# Patient Record
Sex: Male | Born: 1965 | Race: White | Hispanic: No | Marital: Married | Smoking: Current every day smoker
Health system: Southern US, Community
[De-identification: ages and names within clinical notes are randomized; demographics above are authoritative.]

## PROBLEM LIST (undated history)

## (undated) DIAGNOSIS — R1313 Dysphagia, pharyngeal phase: Secondary | ICD-10-CM

## (undated) DIAGNOSIS — R51 Headache: Secondary | ICD-10-CM

## (undated) DIAGNOSIS — M48061 Spinal stenosis, lumbar region without neurogenic claudication: Secondary | ICD-10-CM

## (undated) DIAGNOSIS — K219 Gastro-esophageal reflux disease without esophagitis: Secondary | ICD-10-CM

## (undated) DIAGNOSIS — J189 Pneumonia, unspecified organism: Secondary | ICD-10-CM

---

## 2004-09-02 DIAGNOSIS — J189 Pneumonia, unspecified organism: Secondary | ICD-10-CM

## 2004-09-02 HISTORY — DX: Pneumonia, unspecified organism: J18.9

## 2004-09-02 HISTORY — PX: ANTERIOR CERVICAL DECOMP/DISCECTOMY FUSION: SHX1161

## 2013-09-02 DIAGNOSIS — M48061 Spinal stenosis, lumbar region without neurogenic claudication: Secondary | ICD-10-CM

## 2013-09-02 HISTORY — DX: Spinal stenosis, lumbar region without neurogenic claudication: M48.061

## 2013-10-13 ENCOUNTER — Encounter: Payer: Self-pay | Admitting: Neurology

## 2013-10-14 ENCOUNTER — Ambulatory Visit (INDEPENDENT_AMBULATORY_CARE_PROVIDER_SITE_OTHER): Payer: BC Managed Care – PPO | Admitting: Neurology

## 2013-10-14 ENCOUNTER — Encounter (INDEPENDENT_AMBULATORY_CARE_PROVIDER_SITE_OTHER): Payer: Self-pay

## 2013-10-14 ENCOUNTER — Encounter: Payer: Self-pay | Admitting: Neurology

## 2013-10-14 VITALS — BP 121/76 | HR 88 | Ht 66.5 in | Wt 178.0 lb

## 2013-10-14 DIAGNOSIS — M545 Low back pain, unspecified: Secondary | ICD-10-CM

## 2013-10-14 MED ORDER — GABAPENTIN 300 MG PO CAPS: 300.0000 mg | ORAL_CAPSULE | Freq: Two times a day (BID) | ORAL | Status: DC
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
## 2013-10-14 NOTE — Patient Instructions (Signed)
Overall you are doing fairly well but I do want to suggest a few things today:   Remember to drink plenty of fluid, eat healthy meals and do not skip any meals. Try to eat protein with a every meal and eat a healthy snack such as fruit or nuts in between meals. Try to keep a regular sleep-wake schedule and try to exercise daily, particularly in the form of walking, 20-30 minutes a day, if you can.   As far as your medications are concerned, I would like to suggest the following: 1)Start gabapentin 300mg  twice a day  As far as diagnostic testing:  1)We will order a MRI of your lumbar spine. Someone will call you in a few days to schedule this  We will place a referral for physical therapy to help treat your back pain. They should call you in a few days to schedule this.   Please call us with any interim questions, concerns, problems, updates or refill requests.   My clinical assistant and will answer any of your questions and relay your messages to me and also relay most of my messages to you.   Our phone number is [phone]. We also have an after hours call service for urgent matters and there is a physician on-call for urgent questions. For any emergencies you know to call 911 or go to the nearest emergency room

## 2013-10-27 ENCOUNTER — Ambulatory Visit (INDEPENDENT_AMBULATORY_CARE_PROVIDER_SITE_OTHER): Payer: BC Managed Care – PPO

## 2013-10-27 ENCOUNTER — Other Ambulatory Visit: Payer: Self-pay | Admitting: Neurology

## 2013-10-27 DIAGNOSIS — M545 Low back pain, unspecified: Secondary | ICD-10-CM

## 2013-11-01 ENCOUNTER — Telehealth: Payer: Self-pay | Admitting: Neurology

## 2013-11-01 DIAGNOSIS — M545 Low back pain, unspecified: Secondary | ICD-10-CM

## 2013-11-01 NOTE — Telephone Encounter (Signed)
Called patient back to discuss results. Will refer to back surgeon for further evaluation.

## 2014-03-17 ENCOUNTER — Encounter (HOSPITAL_COMMUNITY): Payer: BC Managed Care – PPO | Admitting: Pharmacy Technician

## 2014-03-21 NOTE — H&P (Addendum)
Ricardo KennerMarc Flores is an 48 y.o. male.   Chief Complaint:  Low back pain and bilat LE radiculopathy HPI:  48 yo wm with hx of L2-S1 stenosis presented to our office today for preop evaluation.  Symptoms unchanged from previous visit.  Wants to proceed with L2-S1 decompression as scheduled this week.  Allergies(Ricardo Flores; 02/22/2014 10:10 AM) Oxycodone-Acetaminophen *ANALGESICS - OPIOID*. Nausea. Codeine Derivatives    Social History(Ricardo Flores; 02/22/2014 10:10 AM) Tobacco use. Current every day smoker. 02/15/2014: smoke(d) 3/4 pack(s) per day    Medication History(Ricardo Flores; 02/22/2014 10:10 AM) Meloxicam (15MG  Tablet, Oral) Active. Medications Reconciled.    Other Problems(Ricardo Flores; 02/22/2014 10:10 AM) Hypercholesterolemia    Review of Systems  Constitutional: Negative.   HENT: Negative.   Eyes: Negative.   Respiratory: Negative.   Cardiovascular: Negative.   Gastrointestinal: Negative.   Genitourinary: Negative.   Musculoskeletal: Positive for back pain.  Skin: Negative.   Neurological: Positive for tingling.  Psychiatric/Behavioral: Negative.     There were no vitals taken for this visit. Physical Exam  Constitutional: He is oriented to person, place, and time. He appears well-developed and well-nourished.  HENT:  Head: Normocephalic and atraumatic.  Eyes: EOM are normal. Pupils are equal, round, and reactive to light.  Neck: Normal range of motion.  Cardiovascular: Normal rate and regular rhythm.   Respiratory: Effort normal and breath sounds normal.  GI: Soft. Bowel sounds are normal.  Neurological: He is alert and oriented to person, place, and time.  Skin: Skin is warm and dry.  Psychiatric: He has a normal mood and affect.     Assessment/Plan Lumbar stenosis.   Will proceed with L2-S1 decompression as scheduled.  Surgical procedure along with potential risks, complications and recovery time discussed.  All questions answered.    Ricardo Flores 03/21/2014, 9:23 AM  His x-rays show no spondylolisthesis or scoliosis. There is no gross instability. I do agree with the MRI report, there is multilevel spinal stenosis. There is severe central stenosis at L2-L3 and L3-L4. There is significant foraminal stenosis L2-L3, L3-L4, and L4-L5 with nerve compression. There is no significant central stenosis at L5-S1, but he does have severe bilateral foraminal stenosis. At this point in time we have had a long conversation about spinal stenosis. We have talked about injection therapy and physical therapy and he indicates that he would like to proceed straight to surgery. Given the severity of the disease, it is not unreasonable to do a lumbar decompression. I do not think instrumented fusion is going to be required because there is no sagittal imbalance. We discussed the risks which include infection, bleeding, nerve damage, death, stroke, paralysis, recurrent spinal stenosis, need for pedicle screws, fusion or cages in the future, ongoing or worse pain, and loss of bowel and bladder control. The goal of surgery is to essentially decompress the spine to improve his quality of life. Essentially what I told him is he will feel the relief that he gets when he forward flexes, but he is not forward flexing. All of his questions were encouraged and addressed. We will get preoperative medical clearance from his primary care physician and he will be out of work for about 3 months with a gradual restricted return to duties over a 3-5 month period.

## 2014-03-22 ENCOUNTER — Encounter (HOSPITAL_COMMUNITY): Payer: Self-pay

## 2014-03-22 ENCOUNTER — Encounter (HOSPITAL_COMMUNITY)
Admission: RE | Admit: 2014-03-22 | Discharge: 2014-03-22 | Disposition: A | Discharge status: Home / Self Care | Payer: BC Managed Care – PPO | Source: Ambulatory Visit | Attending: Orthopedic Surgery | Admitting: Orthopedic Surgery

## 2014-03-22 HISTORY — DX: Pneumonia, unspecified organism: J18.9

## 2014-03-22 HISTORY — DX: Spinal stenosis, lumbar region without neurogenic claudication: M48.061

## 2014-03-22 HISTORY — DX: Dysphagia, pharyngeal phase: R13.13

## 2014-03-22 HISTORY — DX: Gastro-esophageal reflux disease without esophagitis: K21.9

## 2014-03-22 HISTORY — DX: Headache: R51

## 2014-03-22 LAB — BASIC METABOLIC PANEL
Anion gap: 14 (ref 5–15)
BUN: 11 mg/dL (ref 6–23)
CHLORIDE: 101 meq/L (ref 96–112)
CO2: 27 mEq/L (ref 19–32)
Calcium: 9.1 mg/dL (ref 8.4–10.5)
Creatinine, Ser: 0.95 mg/dL (ref 0.50–1.35)
GFR calc non Af Amer: >90 mL/min (ref >90)
GLUCOSE: 62 mg/dL — AB (ref 70–99)
Potassium: 3.9 mEq/L (ref 3.7–5.3)
Sodium: 142 mEq/L (ref 137–147)

## 2014-03-22 LAB — CBC
HCT: 40.8 % (ref 39.0–52.0)
HEMOGLOBIN: 14.2 g/dL (ref 13.0–17.0)
MCH: 30.3 pg (ref 26.0–34.0)
MCHC: 34.8 g/dL (ref 30.0–36.0)
MCV: 87 fL (ref 78.0–100.0)
Platelets: 252 10*3/uL (ref 150–400)
RBC: 4.69 MIL/uL (ref 4.22–5.81)
RDW: 12.8 % (ref 11.5–15.5)
WBC: 7.1 10*3/uL (ref 4.0–10.5)

## 2014-03-22 LAB — SURGICAL PCR SCREEN
MRSA, PCR: NEGATIVE
Staphylococcus aureus: NEGATIVE

## 2014-03-22 NOTE — Pre-Procedure Instructions (Signed)
Ricardo KennerMarc Flores  03/22/2014   Your procedure is scheduled on:  Thursday, July 23  [facility] at 0930 AM.  Call this number if you have problems the morning of surgery: [phone]   Remember:   Do not eat food or drink liquids after midnight.Wednesday night   Take these medicines the morning of surgery with A SIP OF WATER: methocarbamol(Robaxin) as needed   Do not wear jewelry, make-up or nail polish.  Do not wear lotions, powders, or perfumes. You may not wear deodorant.  Do not shave 48 hours prior to surgery. Men may shave face and neck.  Do not bring valuables to the hospital.  [facility] is not responsible   for any belongings or valuables.               Contacts, dentures or bridgework may not be worn into surgery.  Leave suitcase in the car. After surgery it may be brought to your room.  For patients admitted to the hospital, discharge time is determined by your  treatment team.               Patients discharged the day of surgery will not be allowed to drive home.  Name and phone number of your driver:      Special Instructions: [place] - Preparing for Surgery  Before surgery, you can play an important role.  Because skin is not sterile, your skin needs to be as free of germs as possible.  You can reduce the number of germs on you skin by washing with CHG (chlorahexidine gluconate) soap before surgery.  CHG is an antiseptic cleaner which kills germs and bonds with the skin to continue killing germs even after washing.  Please DO NOT use if you have an allergy to CHG or antibacterial soaps.  If your skin becomes reddened/irritated stop using the CHG and inform your nurse when you arrive at Short Stay.  Do not shave (including legs and underarms) for at least 48 hours prior to the first CHG shower.  You may shave your face.  Please follow these instructions carefully:   1.  Shower with CHG Soap the night before surgery and the  morning of  Surgery.  2.  If you choose to wash your hair, wash your hair first as usual with your  normal shampoo.  3.  After you shampoo, rinse your hair and body thoroughly to remove the   Shampoo.  4.  Use CHG as you would any other liquid soap.  You can apply chg directly   to the skin and wash gently with scrungie or a clean washcloth.  5.  Apply the CHG Soap to your body ONLY FROM THE NECK DOWN.   Do not use on open wounds or open sores.  Avoid contact with your eyes,  ears, mouth and genitals (private parts).  Wash genitals (private parts)   with your normal soap.  6.  Wash thoroughly, paying special attention to the area where your surgery  will be performed.  7.  Thoroughly rinse your body with warm water from the neck down.  8.  DO NOT shower/wash with your normal soap after using and rinsing off   the CHG Soap.  9.  Pat yourself dry with a clean towel.            10.  Wear clean pajamas.            11.  Place clean sheets  on your bed the night of your first shower and do not   sleep with pets.  Day of Surgery  Do not apply any lotions/deoderants the morning of surgery.  Please wear clean clothes to the hospital/surgery center.     Please read over the following fact sheets that you were given: Pain Booklet, Coughing and Deep Breathing and Surgical Site Infection Prevention

## 2014-03-23 MED ORDER — DEXAMETHASONE SODIUM PHOSPHATE 4 MG/ML IJ SOLN
4.0000 mg | Freq: Once | INTRAMUSCULAR | Status: AC
  Administered 2014-03-24: 4 mg via INTRAVENOUS

## 2014-03-23 MED ORDER — ACETAMINOPHEN 10 MG/ML IV SOLN
1000.0000 mg | Freq: Four times a day (QID) | INTRAVENOUS | Status: DC
  Administered 2014-03-24: 1000 mg via INTRAVENOUS

## 2014-03-23 NOTE — Progress Notes (Signed)
Anesthesia Chart Review:  Patient is a 48 year old male scheduled for L2-S1 decompression tomorrow by Dr. Shon BatonBrooks.  History includes smoking, GERD, ACDF, cricopharyngeal dysphagia, headaches, lumbar stenosis, PNA '06. PCP is listed as Dr. Venetia ConstableHarry Anthony Alexander.  Preoperative labs noted.  Glucose 62.  His surgery is after 11AM tomorrow, so I'll order a CBG just to make sure there is no significant hypoglycemia while he is NPO.  Velna Ochsllison Lexani Corona, PA-C [facility]/Anesthesiology Phone [phone] 03/23/2014 10:05 AM

## 2014-03-24 ENCOUNTER — Encounter (HOSPITAL_COMMUNITY): Payer: Self-pay | Admitting: Anesthesiology

## 2014-03-24 ENCOUNTER — Encounter (HOSPITAL_COMMUNITY)
Admission: RE | Disposition: A | Discharge status: Home Health | Payer: Self-pay | Source: Ambulatory Visit | Attending: Orthopedic Surgery

## 2014-03-24 ENCOUNTER — Inpatient Hospital Stay (HOSPITAL_COMMUNITY): Payer: BC Managed Care – PPO

## 2014-03-24 ENCOUNTER — Inpatient Hospital Stay (HOSPITAL_COMMUNITY): Payer: BC Managed Care – PPO | Admitting: Anesthesiology

## 2014-03-24 ENCOUNTER — Encounter (HOSPITAL_COMMUNITY): Payer: BC Managed Care – PPO | Admitting: Vascular Surgery

## 2014-03-24 ENCOUNTER — Inpatient Hospital Stay (HOSPITAL_COMMUNITY)
Admission: RE | Admit: 2014-03-24 | Discharge: 2014-03-27 | DRG: 520 | Disposition: A | Discharge status: Home Health | Payer: BC Managed Care – PPO | Source: Ambulatory Visit | Attending: Orthopedic Surgery | Admitting: Orthopedic Surgery

## 2014-03-24 DIAGNOSIS — E7800 Pure hypercholesterolemia, unspecified: Secondary | ICD-10-CM | POA: Diagnosis present

## 2014-03-24 DIAGNOSIS — F17200 Nicotine dependence, unspecified, uncomplicated: Secondary | ICD-10-CM | POA: Diagnosis present

## 2014-03-24 DIAGNOSIS — M48061 Spinal stenosis, lumbar region without neurogenic claudication: Secondary | ICD-10-CM

## 2014-03-24 DIAGNOSIS — Z01812 Encounter for preprocedural laboratory examination: Secondary | ICD-10-CM

## 2014-03-24 DIAGNOSIS — M4800 Spinal stenosis, site unspecified: Secondary | ICD-10-CM | POA: Diagnosis present

## 2014-03-24 DIAGNOSIS — K219 Gastro-esophageal reflux disease without esophagitis: Secondary | ICD-10-CM | POA: Diagnosis present

## 2014-03-24 DIAGNOSIS — R1310 Dysphagia, unspecified: Secondary | ICD-10-CM | POA: Diagnosis present

## 2014-03-24 HISTORY — PX: LUMBAR LAMINECTOMY/DECOMPRESSION MICRODISCECTOMY: SHX5026

## 2014-03-24 LAB — GLUCOSE, CAPILLARY: Glucose-Capillary: 115 mg/dL — ABNORMAL HIGH (ref 70–99)

## 2014-03-24 SURGERY — LUMBAR LAMINECTOMY/DECOMPRESSION MICRODISCECTOMY 4 LEVEL
Anesthesia: General | Site: Spine Lumbar

## 2014-03-24 MED ORDER — LIDOCAINE HCL (CARDIAC) 20 MG/ML IV SOLN
INTRAVENOUS | Status: DC | PRN
  Administered 2014-03-24: 60 mg via INTRAVENOUS

## 2014-03-24 MED ORDER — FENTANYL CITRATE 0.05 MG/ML IJ SOLN
INTRAMUSCULAR | Status: AC
  Filled 2014-03-24: qty 5

## 2014-03-24 MED ORDER — GLYCOPYRROLATE 0.2 MG/ML IJ SOLN
INTRAMUSCULAR | Status: DC | PRN
  Administered 2014-03-24: 0.4 mg via INTRAVENOUS

## 2014-03-24 MED ORDER — FENTANYL CITRATE 0.05 MG/ML IJ SOLN
25.0000 ug | INTRAMUSCULAR | Status: DC | PRN
  Administered 2014-03-24 (×4): 25 ug via INTRAVENOUS

## 2014-03-24 MED ORDER — METHOCARBAMOL 1000 MG/10ML IJ SOLN
500.0000 mg | Freq: Four times a day (QID) | INTRAVENOUS | Status: DC | PRN
  Filled 2014-03-24: qty 5

## 2014-03-24 MED ORDER — PHENYLEPHRINE HCL 10 MG/ML IJ SOLN
INTRAMUSCULAR | Status: DC | PRN
  Administered 2014-03-24 (×2): 40 ug via INTRAVENOUS

## 2014-03-24 MED ORDER — FENTANYL CITRATE 0.05 MG/ML IJ SOLN
INTRAMUSCULAR | Status: AC
  Filled 2014-03-24: qty 2

## 2014-03-24 MED ORDER — NEOSTIGMINE METHYLSULFATE 10 MG/10ML IV SOLN
INTRAVENOUS | Status: DC | PRN
  Administered 2014-03-24: 3 mg via INTRAVENOUS

## 2014-03-24 MED ORDER — MIDAZOLAM HCL 2 MG/2ML IJ SOLN
INTRAMUSCULAR | Status: AC
  Filled 2014-03-24: qty 2

## 2014-03-24 MED ORDER — SODIUM CHLORIDE 0.9 % IJ SOLN
3.0000 mL | Freq: Two times a day (BID) | INTRAMUSCULAR | Status: DC
  Administered 2014-03-26 – 2014-03-27 (×2): 3 mL via INTRAVENOUS

## 2014-03-24 MED ORDER — 0.9 % SODIUM CHLORIDE (POUR BTL) OPTIME
TOPICAL | Status: DC | PRN
  Administered 2014-03-24: 1000 mL

## 2014-03-24 MED ORDER — EPHEDRINE SULFATE 50 MG/ML IJ SOLN
INTRAMUSCULAR | Status: DC | PRN
  Administered 2014-03-24 (×2): 5 mg via INTRAVENOUS

## 2014-03-24 MED ORDER — MENTHOL 3 MG MT LOZG: 1.0000 | LOZENGE | OROMUCOSAL | Status: DC | PRN

## 2014-03-24 MED ORDER — LACTATED RINGERS IV SOLN
INTRAVENOUS | Status: DC
Start: 2014-03-24 — End: 2014-03-27
  Administered 2014-03-24 (×2): via INTRAVENOUS

## 2014-03-24 MED ORDER — METHOCARBAMOL 750 MG PO TABS
750.0000 mg | ORAL_TABLET | Freq: Four times a day (QID) | ORAL | Status: DC | PRN
  Administered 2014-03-24 – 2014-03-27 (×7): 750 mg via ORAL
  Filled 2014-03-24 (×8): qty 1

## 2014-03-24 MED ORDER — THROMBIN 20000 UNITS EX SOLR
CUTANEOUS | Status: AC
  Filled 2014-03-24: qty 20000

## 2014-03-24 MED ORDER — PROPOFOL 10 MG/ML IV BOLUS
INTRAVENOUS | Status: AC
  Filled 2014-03-24: qty 20

## 2014-03-24 MED ORDER — CEFAZOLIN SODIUM 1-5 GM-% IV SOLN
1.0000 g | Freq: Three times a day (TID) | INTRAVENOUS | Status: AC
  Administered 2014-03-24 – 2014-03-25 (×2): 1 g via INTRAVENOUS
  Filled 2014-03-24 (×2): qty 50

## 2014-03-24 MED ORDER — SODIUM CHLORIDE 0.9 % IV SOLN: 250.0000 mL | INTRAVENOUS | Status: DC

## 2014-03-24 MED ORDER — DOCUSATE SODIUM 100 MG PO CAPS
100.0000 mg | ORAL_CAPSULE | Freq: Two times a day (BID) | ORAL | Status: DC
  Administered 2014-03-24 – 2014-03-27 (×6): 100 mg via ORAL
  Filled 2014-03-24 (×7): qty 1

## 2014-03-24 MED ORDER — ACETAMINOPHEN 10 MG/ML IV SOLN
1000.0000 mg | Freq: Once | INTRAVENOUS | Status: DC
  Filled 2014-03-24: qty 100

## 2014-03-24 MED ORDER — DEXAMETHASONE 4 MG PO TABS
4.0000 mg | ORAL_TABLET | Freq: Four times a day (QID) | ORAL | Status: DC
  Administered 2014-03-25 – 2014-03-27 (×7): 4 mg via ORAL
  Filled 2014-03-24 (×15): qty 1

## 2014-03-24 MED ORDER — ALBUMIN HUMAN 5 % IV SOLN
INTRAVENOUS | Status: DC | PRN
  Administered 2014-03-24 (×2): via INTRAVENOUS

## 2014-03-24 MED ORDER — BUPIVACAINE-EPINEPHRINE 0.25% -1:200000 IJ SOLN
INTRAMUSCULAR | Status: DC | PRN
  Administered 2014-03-24: 10 mL

## 2014-03-24 MED ORDER — ONDANSETRON HCL 4 MG/2ML IJ SOLN
4.0000 mg | INTRAMUSCULAR | Status: DC | PRN
  Administered 2014-03-25: 4 mg via INTRAVENOUS
  Filled 2014-03-24: qty 2

## 2014-03-24 MED ORDER — LACTATED RINGERS IV SOLN
INTRAVENOUS | Status: DC
  Administered 2014-03-25: 01:00:00 via INTRAVENOUS

## 2014-03-24 MED ORDER — GLYCOPYRROLATE 0.2 MG/ML IJ SOLN
INTRAMUSCULAR | Status: AC
  Filled 2014-03-24: qty 2

## 2014-03-24 MED ORDER — METHOCARBAMOL 500 MG PO TABS
ORAL_TABLET | ORAL | Status: AC
  Filled 2014-03-24: qty 1

## 2014-03-24 MED ORDER — PROPOFOL 10 MG/ML IV BOLUS
INTRAVENOUS | Status: DC | PRN
  Administered 2014-03-24: 200 mg via INTRAVENOUS

## 2014-03-24 MED ORDER — ROCURONIUM BROMIDE 100 MG/10ML IV SOLN
INTRAVENOUS | Status: DC | PRN
  Administered 2014-03-24 (×2): 10 mg via INTRAVENOUS
  Administered 2014-03-24: 50 mg via INTRAVENOUS

## 2014-03-24 MED ORDER — ONDANSETRON HCL 4 MG/2ML IJ SOLN
INTRAMUSCULAR | Status: DC | PRN
  Administered 2014-03-24: 4 mg via INTRAVENOUS

## 2014-03-24 MED ORDER — ONDANSETRON HCL 4 MG/2ML IJ SOLN
INTRAMUSCULAR | Status: AC
  Filled 2014-03-24: qty 2

## 2014-03-24 MED ORDER — SURGIFOAM 100 EX MISC
CUTANEOUS | Status: DC | PRN
  Administered 2014-03-24: 12:00:00 via TOPICAL

## 2014-03-24 MED ORDER — DEXAMETHASONE SODIUM PHOSPHATE 4 MG/ML IJ SOLN
INTRAMUSCULAR | Status: AC
  Filled 2014-03-24: qty 1

## 2014-03-24 MED ORDER — ONDANSETRON HCL 4 MG/2ML IJ SOLN
4.0000 mg | Freq: Once | INTRAMUSCULAR | Status: AC | PRN
  Administered 2014-03-24: 4 mg via INTRAVENOUS

## 2014-03-24 MED ORDER — OXYCODONE HCL 5 MG PO TABS
10.0000 mg | ORAL_TABLET | ORAL | Status: DC | PRN
  Administered 2014-03-24 – 2014-03-27 (×12): 10 mg via ORAL
  Filled 2014-03-24 (×11): qty 2

## 2014-03-24 MED ORDER — PHENOL 1.4 % MT LIQD
1.0000 | OROMUCOSAL | Status: DC | PRN
  Filled 2014-03-24: qty 177

## 2014-03-24 MED ORDER — OXYCODONE HCL 5 MG PO TABS
ORAL_TABLET | ORAL | Status: AC
  Filled 2014-03-24: qty 2

## 2014-03-24 MED ORDER — ACETAMINOPHEN 10 MG/ML IV SOLN
1000.0000 mg | Freq: Four times a day (QID) | INTRAVENOUS | Status: AC
  Administered 2014-03-24: 1000 mg via INTRAVENOUS
  Filled 2014-03-24: qty 100

## 2014-03-24 MED ORDER — ROCURONIUM BROMIDE 50 MG/5ML IV SOLN
INTRAVENOUS | Status: AC
  Filled 2014-03-24: qty 1

## 2014-03-24 MED ORDER — SODIUM CHLORIDE 0.9 % IJ SOLN: 3.0000 mL | INTRAMUSCULAR | Status: DC | PRN

## 2014-03-24 MED ORDER — NEOSTIGMINE METHYLSULFATE 10 MG/10ML IV SOLN
INTRAVENOUS | Status: AC
  Filled 2014-03-24: qty 1

## 2014-03-24 MED ORDER — DEXAMETHASONE SODIUM PHOSPHATE 4 MG/ML IJ SOLN
4.0000 mg | Freq: Four times a day (QID) | INTRAMUSCULAR | Status: DC
  Administered 2014-03-24 – 2014-03-27 (×4): 4 mg via INTRAVENOUS
  Filled 2014-03-24 (×15): qty 1

## 2014-03-24 MED ORDER — BUPIVACAINE-EPINEPHRINE (PF) 0.25% -1:200000 IJ SOLN
INTRAMUSCULAR | Status: AC
  Filled 2014-03-24: qty 30

## 2014-03-24 MED ORDER — FENTANYL CITRATE 0.05 MG/ML IJ SOLN
INTRAMUSCULAR | Status: DC | PRN
  Administered 2014-03-24 (×4): 50 ug via INTRAVENOUS
  Administered 2014-03-24: 100 ug via INTRAVENOUS
  Administered 2014-03-24 (×2): 50 ug via INTRAVENOUS

## 2014-03-24 MED ORDER — MIDAZOLAM HCL 5 MG/5ML IJ SOLN
INTRAMUSCULAR | Status: DC | PRN
  Administered 2014-03-24: 2 mg via INTRAVENOUS

## 2014-03-24 MED ORDER — METHOCARBAMOL 500 MG PO TABS
500.0000 mg | ORAL_TABLET | Freq: Four times a day (QID) | ORAL | Status: DC | PRN
  Administered 2014-03-24: 500 mg via ORAL

## 2014-03-24 MED ORDER — ARTIFICIAL TEARS OP OINT
TOPICAL_OINTMENT | OPHTHALMIC | Status: DC | PRN
  Administered 2014-03-24: 1 via OPHTHALMIC

## 2014-03-24 SURGICAL SUPPLY — 60 items
BUR EGG ELITE 4.0 (BURR) IMPLANT
BUR MATCHSTICK NEURO 3.0 LAGG (BURR) ×2 IMPLANT
CANISTER SUCTION 2500CC (MISCELLANEOUS) ×2 IMPLANT
CORDS BIPOLAR (ELECTRODE) ×2 IMPLANT
COVER SURGICAL LIGHT HANDLE (MISCELLANEOUS) ×2 IMPLANT
DERMABOND ADVANCED (GAUZE/BANDAGES/DRESSINGS) ×1
DERMABOND ADVANCED .7 DNX12 (GAUZE/BANDAGES/DRESSINGS) ×1 IMPLANT
DRAIN CHANNEL 15F RND FF W/TCR (WOUND CARE) IMPLANT
DRAPE POUCH INSTRU U-SHP 10X18 (DRAPES) ×2 IMPLANT
DRAPE SURG 17X23 STRL (DRAPES) ×2 IMPLANT
DRAPE U-SHAPE 47X51 STRL (DRAPES) ×2 IMPLANT
DRSG MEPILEX BORDER 4X8 (GAUZE/BANDAGES/DRESSINGS) IMPLANT
DURAPREP 26ML APPLICATOR (WOUND CARE) ×2 IMPLANT
ELECT BLADE 4.0 EZ CLEAN MEGAD (MISCELLANEOUS)
ELECT CAUTERY BLADE 6.4 (BLADE) ×2 IMPLANT
ELECT PENCIL ROCKER SW 15FT (MISCELLANEOUS) ×2 IMPLANT
ELECT REM PT RETURN 9FT ADLT (ELECTROSURGICAL) ×2
ELECTRODE BLDE 4.0 EZ CLN MEGD (MISCELLANEOUS) IMPLANT
ELECTRODE REM PT RTRN 9FT ADLT (ELECTROSURGICAL) ×1 IMPLANT
EVACUATOR 1/8 PVC DRAIN (DRAIN) IMPLANT
EVACUATOR SILICONE 100CC (DRAIN) IMPLANT
GLOVE BIOGEL PI IND STRL 8 (GLOVE) ×1 IMPLANT
GLOVE BIOGEL PI IND STRL 8.5 (GLOVE) ×1 IMPLANT
GLOVE BIOGEL PI INDICATOR 8 (GLOVE) ×1
GLOVE BIOGEL PI INDICATOR 8.5 (GLOVE) ×1
GLOVE ECLIPSE 8.5 STRL (GLOVE) ×2 IMPLANT
GLOVE ORTHO TXT STRL SZ7.5 (GLOVE) ×2 IMPLANT
GOWN STRL REUS W/ TWL LRG LVL3 (GOWN DISPOSABLE) ×1 IMPLANT
GOWN STRL REUS W/TWL 2XL LVL3 (GOWN DISPOSABLE) ×4 IMPLANT
GOWN STRL REUS W/TWL LRG LVL3 (GOWN DISPOSABLE) ×1
KIT BASIN OR (CUSTOM PROCEDURE TRAY) ×2 IMPLANT
KIT ROOM TURNOVER OR (KITS) ×2 IMPLANT
NEEDLE 22X1 1/2 (OR ONLY) (NEEDLE) ×2 IMPLANT
NEEDLE SPNL 18GX3.5 QUINCKE PK (NEEDLE) ×4 IMPLANT
NS IRRIG 1000ML POUR BTL (IV SOLUTION) ×2 IMPLANT
PACK LAMINECTOMY ORTHO (CUSTOM PROCEDURE TRAY) ×2 IMPLANT
PACK UNIVERSAL I (CUSTOM PROCEDURE TRAY) ×2 IMPLANT
PAD ARMBOARD 7.5X6 YLW CONV (MISCELLANEOUS) ×4 IMPLANT
PATTIES SURGICAL .5 X.5 (GAUZE/BANDAGES/DRESSINGS) IMPLANT
PATTIES SURGICAL .5 X1 (DISPOSABLE) ×4 IMPLANT
SPONGE LAP 4X18 X RAY DECT (DISPOSABLE) ×4 IMPLANT
SPONGE SURGIFOAM ABS GEL 100 (HEMOSTASIS) IMPLANT
STRIP CLOSURE SKIN 1/2X4 (GAUZE/BANDAGES/DRESSINGS) ×2 IMPLANT
SURGIFLO TRUKIT (HEMOSTASIS) IMPLANT
SURGIFLO W/THROMBIN 8M KIT (HEMOSTASIS) ×4 IMPLANT
SUT BONE WAX W31G (SUTURE) ×2 IMPLANT
SUT MON AB 3-0 SH 27 (SUTURE)
SUT MON AB 3-0 SH27 (SUTURE) IMPLANT
SUT VIC AB 0 CT1 27 (SUTURE)
SUT VIC AB 0 CT1 27XBRD ANBCTR (SUTURE) IMPLANT
SUT VIC AB 1 CTX 36 (SUTURE)
SUT VIC AB 1 CTX36XBRD ANBCTR (SUTURE) IMPLANT
SUT VIC AB 2-0 CT1 18 (SUTURE) IMPLANT
SYR BULB IRRIGATION 50ML (SYRINGE) ×2 IMPLANT
SYR CONTROL 10ML LL (SYRINGE) ×2 IMPLANT
TOWEL OR 17X24 6PK STRL BLUE (TOWEL DISPOSABLE) ×2 IMPLANT
TOWEL OR 17X26 10 PK STRL BLUE (TOWEL DISPOSABLE) ×2 IMPLANT
TRAY FOLEY CATH 16FRSI W/METER (SET/KITS/TRAYS/PACK) ×2 IMPLANT
WATER STERILE IRR 1000ML POUR (IV SOLUTION) ×2 IMPLANT
YANKAUER SUCT BULB TIP NO VENT (SUCTIONS) ×2 IMPLANT

## 2014-03-24 NOTE — Anesthesia Procedure Notes (Signed)
Procedure Name: Intubation Date/Time: 03/24/2014 11:34 AM Performed by: Delia ChimesVOSH, Jazmine Longshore E Pre-anesthesia Checklist: Patient identified, Timeout performed, Emergency Drugs available, Suction available and Patient being monitored Patient Re-evaluated:Patient Re-evaluated prior to inductionOxygen Delivery Method: Circle system utilized Preoxygenation: Pre-oxygenation with 100% oxygen Intubation Type: IV induction and Cricoid Pressure applied Ventilation: Mask ventilation without difficulty Laryngoscope Size: Mac and 3 Grade View: Grade III Tube type: Oral Tube size: 8.0 mm Number of attempts: 3 Airway Equipment and Method: Stylet and Bougie stylet Placement Confirmation: breath sounds checked- equal and bilateral and positive ETCO2 Secured at: 22 cm Tube secured with: Tape Dental Injury: Teeth and Oropharynx as per pre-operative assessment  Difficulty Due To: Difficulty was unanticipated, Difficult Airway- due to reduced neck mobility and Difficult Airway- due to anterior larynx Future Recommendations: Recommend- induction with short-acting agent, and alternative techniques readily available Comments: Pt. Had previous ACDF and limited hyperextension of neck.  Arytenoids visualized upon DL with cricoid pressure x 2.  Bougie used upon 3rd attempt and small amount of vocal cords visualized; Endotracheal intubation successful- +EtCO2 and B/L breath sounds verified by Dr. Noreene LarssonJoslin.

## 2014-03-24 NOTE — Anesthesia Preprocedure Evaluation (Addendum)
Anesthesia Evaluation  Patient identified by MRN, date of birth, ID band Patient awake    Reviewed: Allergy & Precautions, H&P , NPO status , Patient's Chart, lab work & pertinent test results  Airway Mallampati: III TM Distance: <3 FB Neck ROM: Limited    Dental  (+) Teeth Intact, Dental Advisory Given   Pulmonary Current Smoker,  breath sounds clear to auscultation        Cardiovascular Rhythm:Regular Rate:Normal     Neuro/Psych  Neuromuscular disease    GI/Hepatic GERD-  Medicated and Controlled,  Endo/Other    Renal/GU      Musculoskeletal   Abdominal   Peds  Hematology   Anesthesia Other Findings Lumbar stenosis Hx of ACDF Cricopharyngeal dysphagia; difficulty swallowing dairy products Limited Hyperextension of neck  Reproductive/Obstetrics                          Anesthesia Physical Anesthesia Plan  ASA: II  Anesthesia Plan: General   Post-op Pain Management:    Induction: Intravenous  Airway Management Planned: Oral ETT  Additional Equipment:   Intra-op Plan:   Post-operative Plan: Extubation in OR  Informed Consent: I have reviewed the patients History and Physical, chart, labs and discussed the procedure including the risks, benefits and alternatives for the proposed anesthesia with the patient or authorized representative who has indicated his/her understanding and acceptance.   Dental advisory given  Plan Discussed with: CRNA, Anesthesiologist and Surgeon  Anesthesia Plan Comments:         Anesthesia Quick Evaluation

## 2014-03-24 NOTE — Transfer of Care (Signed)
Immediate Anesthesia Transfer of Care Note  Patient: Allyn KennerMarc Yoho  Procedure(s) Performed: Procedure(s): LUMBAR DECOMPRESSION L2-S1 (N/A)  Patient Location: PACU  Anesthesia Type:General  Level of Consciousness: lethargic and responds to stimulation  Airway & Oxygen Therapy: Patient Spontanous Breathing and Patient connected to face mask oxygen  Post-op Assessment: Report given to PACU RN and Post -op Vital signs reviewed and stable  Post vital signs: Reviewed and stable  Complications: No apparent anesthesia complications

## 2014-03-24 NOTE — Addendum Note (Signed)
Addendum created 03/24/14 1853 by Nelson ChimesAshley E Janae Bonser, CRNA   Modules edited: Anesthesia Events

## 2014-03-24 NOTE — Brief Op Note (Signed)
03/24/2014  3:28 PM  PATIENT:  Ricardo Flores  48 y.o. male  PRE-OPERATIVE DIAGNOSIS:  SPINAL STENOSIS  POST-OPERATIVE DIAGNOSIS:  spinal stenosis  PROCEDURE:  Procedure(s): LUMBAR DECOMPRESSION L2-S1 (N/A)  SURGEON:  Surgeon(s) and Role:    * Venita Lickahari Raequan Vanschaick, MD - Primary  PHYSICIAN ASSISTANT:   ASSISTANTS: Zonia KiefJames Owens   ANESTHESIA:   general  EBL:  Total I/O In: 2200 [I.V.:1700; IV Piggyback:500] Out: 660 [Urine:160; Blood:500]  BLOOD ADMINISTERED:none  DRAINS: none   LOCAL MEDICATIONS USED:  MARCAINE     SPECIMEN:  No Specimen  DISPOSITION OF SPECIMEN:  N/A  COUNTS:  YES  TOURNIQUET:  * No tourniquets in log *  DICTATION: .Other Dictation: Dictation Number [id number]  PLAN OF CARE: Admit to inpatient   PATIENT DISPOSITION:  stable

## 2014-03-24 NOTE — Addendum Note (Signed)
Addendum created 03/24/14 1646 by Nelson ChimesAshley E Bayleigh Loflin, CRNA   Modules edited: Anesthesia Events, Anesthesia Flowsheet

## 2014-03-24 NOTE — Anesthesia Postprocedure Evaluation (Signed)
Anesthesia Post Note  Patient: Ricardo Flores  Procedure(s) Performed: Procedure(s) (LRB): LUMBAR DECOMPRESSION L2-S1 (N/A)  Anesthesia type: General  Patient location: PACU  Post pain: Pain level controlled and Adequate analgesia  Post assessment: Post-op Vital signs reviewed, Patient's Cardiovascular Status Stable, Respiratory Function Stable, Patent Airway and Pain level controlled  Last Vitals:  Filed Vitals:   03/24/14 1610  BP:   Pulse: 87  Temp:   Resp: 10    Post vital signs: Reviewed and stable  Level of consciousness: awake, alert  and oriented  Complications: No apparent anesthesia complications

## 2014-03-25 ENCOUNTER — Encounter (HOSPITAL_COMMUNITY): Payer: Self-pay | Admitting: Orthopedic Surgery

## 2014-03-25 MED ORDER — OXYCODONE-ACETAMINOPHEN 10-325 MG PO TABS: 1.0000 | ORAL_TABLET | Freq: Four times a day (QID) | ORAL | Status: AC | PRN

## 2014-03-25 MED ORDER — ONDANSETRON 4 MG PO TBDP: 4.0000 mg | ORAL_TABLET | Freq: Three times a day (TID) | ORAL | Status: AC | PRN

## 2014-03-25 MED ORDER — DOCUSATE SODIUM 100 MG PO CAPS: 100.0000 mg | ORAL_CAPSULE | Freq: Two times a day (BID) | ORAL | Status: AC

## 2014-03-25 MED ORDER — HYDROMORPHONE HCL PF 1 MG/ML IJ SOLN
2.0000 mg | Freq: Once | INTRAMUSCULAR | Status: AC
  Administered 2014-03-25: 2 mg via INTRAVENOUS
  Filled 2014-03-25: qty 2

## 2014-03-25 MED ORDER — METHOCARBAMOL 500 MG PO TABS: 500.0000 mg | ORAL_TABLET | Freq: Four times a day (QID) | ORAL | Status: AC | PRN

## 2014-03-25 MED ORDER — POLYETHYLENE GLYCOL 3350 17 G PO PACK: 17.0000 g | PACK | Freq: Every day | ORAL | Status: AC

## 2014-03-25 NOTE — Progress Notes (Signed)
    Subjective: Procedure(s) (LRB): LUMBAR DECOMPRESSION L2-S1 (N/A) 1 Day Post-Op  Patient reports pain as 2 on 0-10 scale.  Reports decreased leg pain reports incisional back pain   Negative void Negative bowel movement Negative flatus Negative chest pain or shortness of breath  Objective: Vital signs in last 24 hours: Temp:  [97.7 F (36.5 C)-99.2 F (37.3 C)] 98 F (36.7 C) (07/24 0600) Pulse Rate:  [69-106] 73 (07/24 0600) Resp:  [9-22] 18 (07/24 0600) BP: (92-134)/(52-70) 94/54 mmHg (07/24 0600) SpO2:  [95 %-100 %] 97 % (07/24 0600) Weight:  [175 lb (79.379 kg)] 175 lb (79.379 kg) (07/23 0937)  Intake/Output from previous day: 07/23 0701 - 07/24 0700 In: 3177.5 [I.V.:2677.5; IV Piggyback:500] Out: 2210 [Urine:1710; Blood:500]  Labs:  Recent Labs  03/22/14 1546  WBC 7.1  RBC 4.69  HCT 40.8  PLT 252    Recent Labs  03/22/14 1546  NA 142  K 3.9  CL 101  CO2 27  BUN 11  CREATININE 0.95  GLUCOSE 62*  CALCIUM 9.1   No results found for this basename: LABPT, INR,  in the last 72 hours  Physical Exam: Neurologically intact ABD soft Neurovascular intact Intact pulses distally Incision: dressing C/D/I and no drainage Compartment soft  Assessment/Plan: Patient stable  xrays n/a Continue mobilization with physical therapy Continue care  Advance diet Up with therapy D/C IV fluids Plan on d/c to home Saturday if cleared   Venita Lickahari Nekia Maxham, MD [facility] [phone]

## 2014-03-25 NOTE — Progress Notes (Signed)
Utilization review completed. Kihanna Kamiya, RN, BSN. 

## 2014-03-25 NOTE — Evaluation (Signed)
Occupational Therapy Evaluation Patient Details Name: Ricardo KennerMarc Flores MRN: [id number] DOB: 1965-10-28 Today's Date: 03/25/2014    History of Present Illness Posterior decompression of L2-S1.   Clinical Impression   This 48 yo male admitted and underwent above presents to acute OT with increased pain, decreased mobility, decreased balance due to back pain, back precautions all affecting pt's ability to care for himself and for family to A him. Pt will benefit from acute OT for education that will get him back to an Independent or Mod I level once pain is better under control.    Follow Up Recommendations  No OT follow up    Equipment Recommendations   (pt checking to see if they have 3n1 they can borrow)       Precautions / Restrictions Precautions Precautions: Back Precaution Booklet Issued: Yes (comment) Required Braces or Orthoses: Spinal Brace Spinal Brace: Applied in sitting position Restrictions Weight Bearing Restrictions: No      Mobility Bed Mobility Overal bed mobility: Needs Assistance Bed Mobility: Sit to Sidelying;Rolling Rolling: Min assist (with increased time due to pain)       Sit to sidelying: Mod assist    Transfers Overall transfer level: Needs assistance Equipment used: Rolling walker (2 wheeled) Transfers: Sit to/from Stand Sit to Stand: Min assist         General transfer comment: Vcs for safe hand placement    Balance Overall balance assessment: Needs assistance Sitting-balance support: Bilateral upper extremity supported;Feet supported Sitting balance-Leahy Scale: Poor     Standing balance support: Bilateral upper extremity supported Standing balance-Leahy Scale: Poor                              ADL Overall ADL's : Needs assistance/impaired Eating/Feeding: Independent;Sitting   Grooming: Set up;Sitting   Upper Body Bathing: Minimal assitance;Sitting   Lower Body Bathing: Total assistance;Cueing for back  precautions;Sit to/from stand   Upper Body Dressing : Moderate assistance;Sitting   Lower Body Dressing: Total assistance;Cueing for back precautions;Sit to/from stand   Toilet Transfer: Minimal assistance;Ambulation;RW (recliner>bed 3 feet away)   Toileting- Clothing Manipulation and Hygiene: Total assistance;Sit to/from stand                         Pertinent Vitals/Pain According to pt greater than 10/10 in back (pt having the "shakes" and uncontrollable movements of arms and legs due to severe pain with movement; pre-medicated; repositioned. Pt also c/o of neck stiffness from tensing up so much due to pain (heat packs applied contained in a pillow case)     Hand Dominance Right   Extremity/Trunk Assessment Upper Extremity Assessment Upper Extremity Assessment: Overall WFL for tasks assessed   Lower Extremity Assessment Lower Extremity Assessment: Defer to PT evaluation       Communication Communication Communication: No difficulties   Cognition Arousal/Alertness: Awake/alert Behavior During Therapy: WFL for tasks assessed/performed Overall Cognitive Status: Within Functional Limits for tasks assessed                                Home Living Family/patient expects to be discharged to:: Private residence Living Arrangements: Spouse/significant other Available Help at Discharge: Family (24 hours for a week then intermittently) Type of Home: House Home Access: Stairs to enter Enter[employer] CorporationEntrance Stairs-Number of Steps: 9 Entrance Stairs-Rails: Right;Left;Can reach both Home Layout: One level  Bathroom Shower/Tub: Walk-in Pensions consultant: Standard     Home Equipment: Environmental consultant - 2 wheels;Shower seat - built in (checking to see if he has a 3n1 he can borrow)          Prior Functioning/Environment Level of Independence: Independent             OT Diagnosis: Generalized weakness;Acute pain   OT Problem List: Decreased  strength;Decreased range of motion;Decreased activity tolerance;Impaired balance (sitting and/or standing);Pain;Decreased knowledge of precautions;Decreased knowledge of use of DME or AE   OT Treatment/Interventions: Self-care/ADL training;Patient/family education;Balance training;DME and/or AE instruction    OT Goals(Current goals can be found in the care plan section) Acute Rehab OT Goals Patient Stated Goal: to get pain under control OT Goal Formulation: With patient Time For Goal Achievement: 04/01/14 Potential to Achieve Goals: Good  OT Frequency: Min 3X/week              End of Session Equipment Utilized During Treatment: Gait belt;Rolling walker;Back brace Nurse Communication: Mobility status (need for brace when up, it is not good for back patients to recline)  Activity Tolerance: Patient limited by pain Patient left: in bed;with call bell/phone within reach   Time: 5409-8119 OT Time Calculation (min): 42 min Charges:  OT General Charges $OT Visit: 1 Procedure OT Evaluation $Initial OT Evaluation Tier I: 1 Procedure OT Treatments $Self Care/Home Management : 38-52 mins  Evette Georges [phone] 03/25/2014, 11:40 AM

## 2014-03-25 NOTE — Clinical Social Work Note (Signed)
Clinical Social Worker received referral for possible ST-SNF placement.  Chart reviewed.  PT/OT evaluations pending, however [facility]  Spoke with RN Case Manager who will follow up with patient to discuss home health needs if deemed appropriate.    CSW signing off - please re consult if social work needs arise.  Macario GoldsJesse Marsha Hillman, [place]CS[place] [phone]

## 2014-03-25 NOTE — Evaluation (Signed)
Physical Therapy Evaluation Patient Details Name: Ricardo Flores MRN: [id number] DOB: [birth year] Today's Date: 03/25/2014   History of Present Illness  Posterior decompression of L2-S1.  Clinical Impression  Pt mobility limited by pain.  Pt agreeable to attempt OOB to bathroom to void.  Pt with increased time for all mobility.  Will continue to follow.      Follow Up Recommendations Home health PT;Supervision/Assistance - 24 hour    Equipment Recommendations  Rolling walker with 5" wheels    Recommendations for Other Services       Precautions / Restrictions Precautions Precautions: Back Precaution Comments: Reviewed back precautions.   Required Braces or Orthoses: Spinal Brace Spinal Brace: Lumbar corset;Applied in sitting position Restrictions Weight Bearing Restrictions: No      Mobility  Bed Mobility Overal bed mobility: Needs Assistance Bed Mobility: Rolling;Sidelying to Sit;Sit to Sidelying Rolling: Min assist Sidelying to sit: Min assist     Sit to sidelying: Mod assist General bed mobility comments: cues for sequencing and back precautions.  A with bringing Bil LEs back to bed.    Transfers Overall transfer level: Needs assistance Equipment used: Rolling walker (2 wheeled) Transfers: Sit to/from Stand Sit to Stand: Min assist         General transfer comment: Vcs for safe hand placement  Ambulation/Gait Ambulation/Gait assistance: Min guard Ambulation Distance (Feet): 10 Feet (x2) Assistive device: Rolling walker (2 wheeled) Gait Pattern/deviations: Step-through pattern;Decreased stride length     General Gait Details: pt moves veryslowly and labored 2/2 pain.    Stairs            Wheelchair Mobility    Modified Rankin (Stroke Patients Only)       Balance Overall balance assessment: Needs assistance Sitting-balance support: Single extremity supported;Feet supported Sitting balance-Leahy Scale: Poor     Standing balance support:  Bilateral upper extremity supported Standing balance-Leahy Scale: Poor                               Pertinent Vitals/Pain 7/10.  Denied pain meds.      Home Living Family/patient expects to be discharged to:: Private residence Living Arrangements: Spouse/significant other Available Help at Discharge: Family (24 hours for a week then intermittently) Type of Home: House Home Access: Stairs to enter Entrance Stairs-Rails: Right;Left;Can reach both Entrance Stairs-Number of Steps: 9 Home Layout: One level Home Equipment: Walker - 2 wheels;Shower seat - built in (checking to see if he has a 3n1 he can borrow)      Prior Function Level of Independence: Independent               Hand Dominance   Dominant Hand: Right    Extremity/Trunk Assessment   Upper Extremity Assessment: Defer to OT evaluation           Lower Extremity Assessment: Generalized weakness      Cervical / Trunk Assessment: Normal  Communication   Communication: No difficulties  Cognition Arousal/Alertness: Awake/alert Behavior During Therapy: WFL for tasks assessed/performed Overall Cognitive Status: Within Functional Limits for tasks assessed                      General Comments      Exercises        Assessment/Plan    PT Assessment Patient needs continued PT services  PT Diagnosis Difficulty walking;Acute pain   PT Problem List Decreased strength;Decreased activity tolerance;Decreased balance;Decreased mobility;Decreased knowledge  of use of DME;Decreased knowledge of precautions;Pain  PT Treatment Interventions DME instruction;Gait training;Stair training;Functional mobility training;Therapeutic activities;Therapeutic exercise;Balance training;Patient/family education   PT Goals (Current goals can be found in the Care Plan section) Acute Rehab PT Goals Patient Stated Goal: to get pain under control PT Goal Formulation: With patient Time For Goal Achievement:  04/08/14 Potential to Achieve Goals: Good    Frequency Min 5X/week   Barriers to discharge        Co-evaluation               End of Session Equipment Utilized During Treatment: Gait belt;Back brace Activity Tolerance: Patient limited by pain Patient left: in bed;with call bell/phone within reach Nurse Communication: Mobility status         Time: 1610-9604 PT Time Calculation (min): 70 min   Charges:   PT Evaluation $Initial PT Evaluation Tier I: 1 Procedure PT Treatments $Gait Training: 8-22 mins $Therapeutic Activity: 38-52 mins   PT G CodesSunny Schlein, [place] [phone] 03/25/2014, 3:44 PM

## 2014-03-25 NOTE — Op Note (Signed)
NAMEMILAM, [name]                  ACCOUNT NO.:  [id number]  MEDICAL RECORD NO.:  [id number]  LOCATION:  [id number]                        FACILITY:  [facility]  PHYSICIAN:  Ricardo Beal, MD    DATE OF BIRTH:  07/05/1966  DATE OF PROCEDURE:  03/24/2014 DATE OF DISCHARGE:                              OPERATIVE REPORT   PREOPERATIVE DIAGNOSIS:  Multilevel lumbar spinal stenosis.  POSTOPERATIVE DIAGNOSIS:  Multilevel lumbar spinal stenosis.  OPERATIVE PROCEDURE:  Posterior decompression of L2-S1.  COMPLICATIONS:  None.  FIRST ASSISTANT:  Ricardo Flores. Ricardo Meek., my PA.  HISTORY:  Ricardo Flores is a very pleasant gentleman who has been having significant back, buttock, and bilateral leg pain consistent with neurogenic claudication.  He attempted conservative management, failed to alleviate his symptoms.  As a result, we elected to proceed with surgery.  The patient had significant spinal stenosis multilevel and so the decision made take him to the operating room for a lumbar decompression.  All risks, benefits, and alternatives were discussed with the patient and consent was obtained.  OPERATIVE NOTE:  The patient was brought to the operating room, placed supine on the operating table.  After successful induction of general anesthesia and endotracheal intubation, TEDs, SCDs, and Foley were inserted.  The patient was turned prone onto a Wilson frame.  All bony prominences were well padded.  The back was prepped and draped in standard fashion.  Time-out was taken to confirm patient, procedure, and all other pertinent important data.  Once this was done, a midline incision was made starting at the superior aspect of L2 and proceeding to the inferior aspect of S1.  Sharp dissection was carried down to the deep fascia.  Deep fascia was sharply incised and I stripped the paraspinal muscles to expose the L2, 3, 4, 5 spinous process lamina and adjoining facet complex.  Care was taken not to violate the  actual site of capsule.  Self-retaining retractors were placed.  An intraoperative x- ray was taken to confirm the L4 lamina.  Once this was done, I then removed the L5 spinous process in its entirety and then developed a plane underneath the lamina using a nerve curette.  I then used a 2 and 3 mm Kerrison to perform a central decompression.  Once I had the complete L5 laminectomy done, I then went into the lateral recesses decompressing the lateral recess.  There was significant thickened ligamentum flavum with nerve compression.  I identified the pedicle and then went into the foramen and decompressed the nerve root.  This was done bilaterally using 2 and 3 mm Kerrison punches.  I then identified the disk space level and then the neural foramen.  Once the central and lateral recess decompression, foraminotomies were completed.  I then removed the L4 spinous process, and using the same technique, I performed a central decompression of L4.  I then completed the L5 central laminectomy once this was done, and then continued my dissection into the lateral recess.  Again, this was accomplished with Kerrison punches.  I continued the same decompression up until L2.  I removed a portion of the L2 spinous process, and performed an L2 laminotomy.  Once I had the central decompression completed, I again worked into the lateral recess identifying the nerve root, the pedicle.  I then carried my dissection out to the medial aspect of the pedicle.  I then performing a foraminotomy and releasing the nerve root from tension. Once I had completed this, I could took my [facility] elevator and passed it superiorly, inferiorly, and out each of the foramen and along the lateral recess at all levels and there was no significant nerve compression.  At this point, I irrigated copiously with normal saline, used bipolar cautery and FloSeal to obtain hemostasis.  Once this was done, I irrigated again.  There was no  active bleeding.  I placed a thrombin-soaked Gelfoam patty over the exposed thecal sac.  I then closed the fascia with running #1 Prolene barbed suture and then a 2-0 Prolene and 3-0 Monocryl.  Steri-Strips and a dry dressing were applied.  The patient was extubated, transferred to PACU without incident.  At the end of the case, all needle and sponge counts were correct.  There were no adverse intraoperative events.  First assistant was Zonia KiefJames Owens, my PA, he was instrumental in assisting with retraction, visualization, wound closure.     Ricardo Bealahari [name], MD     DDB/MEDQ  D:  03/24/2014  T:  03/25/2014  Job:  [id number]

## 2014-03-25 NOTE — Plan of Care (Signed)
Problem: Consults Goal: Diagnosis - Spinal Surgery Outcome: Completed/Met Date Met:  03/25/14 Lumbar Laminectomy (Complex) L2-S1 decompression

## 2014-03-26 NOTE — Progress Notes (Signed)
Subjective: 2 Days Post-Op Procedure(s) (LRB): LUMBAR DECOMPRESSION L2-S1 (N/A) Patient reports pain as moderate.  Sitting in chair this AM, was uncomfortable in bed. Leg pain is improved but he has ongoing weakness in the legs. Voiding without difficulty. No BM yet, + flatulence.  Objective: Vital signs in last 24 hours: Temp:  [97.7 F (36.5 C)-98.7 F (37.1 C)] 97.7 F (36.5 C) (07/25 0550) Pulse Rate:  [63-84] 63 (07/25 0550) Resp:  [18] 18 (07/25 0550) BP: (100-113)/(50-61) 113/61 mmHg (07/25 0550) SpO2:  [97 %-98 %] 98 % (07/25 0550)  Intake/Output from previous day: 07/24 0701 - 07/25 0700 In: 1751.3 [P.O.:540; I.V.:1211.3] Out: 2500 [Urine:2500] Intake/Output this shift: Total I/O In: 240 [P.O.:240] Out: -   No results found for this basename: HGB,  in the last 72 hours No results found for this basename: WBC, RBC, HCT, PLT,  in the last 72 hours No results found for this basename: NA, K, CL, CO2, BUN, CREATININE, GLUCOSE, CALCIUM,  in the last 72 hours No results found for this basename: LABPT, INR,  in the last 72 hours  Neurologically intact ABD soft Neurovascular intact Sensation intact distally Intact pulses distally Dorsiflexion/Plantar flexion intact Incision: dressing C/D/I and no drainage No cellulitis present Compartment soft no calf pain or sign of DVT  Assessment/Plan: 2 Days Post-Op Procedure(s) (LRB): LUMBAR DECOMPRESSION L2-S1 (N/A) Advance diet Up with therapy D/C IV fluids Discussed Lspine precautions Encouraged walking with walker for support Continue back brace Discussed possible D/c later today after PT if pain well controlled but more likely tomorrow  Andrez GrimeBISSELL, Conroy Goracke M. 03/26/2014, 9:09 AM

## 2014-03-26 NOTE — Progress Notes (Signed)
Occupational Therapy Treatment Patient Details Name: Ricardo Flores MRN: [id number] DOB: May 03, 1966 Today's Date: 03/26/2014    History of present illness Posterior decompression of L2-S1.   OT comments  With pain better controlled, pt able to absorb education in back precautions related to ADLand IADL  Instructed in use of toilet tongs, and AE for LB bathing and dressing.  Pt has a 3 in 1 he can borrow, educated pt in its use as a shower seat and to elevate toilet.  Will address shower transfer next visit.  Follow Up Recommendations  No OT follow up    Equipment Recommendations  None recommended by OT    Recommendations for Other Services      Precautions / Restrictions Precautions Precautions: Back;Fall Precaution Comments: reviewed back precautions related to ADL and IADL Required Braces or Orthoses: Spinal Brace Spinal Brace: Lumbar corset;Applied in sitting position       Mobility Bed Mobility Overal bed mobility:  (pt up in chair)             General bed mobility comments: Patient sitting up in recliner. Able to verbalize log roll technique  Transfers Overall transfer level: Needs assistance Equipment used: Rolling walker (2 wheeled) Transfers: Sit to/from Stand Sit to Stand: Min guard         General transfer comment: Vcs for safe hand placement    Balance     Sitting balance-Leahy Scale: Good       Standing balance-Leahy Scale: Fair                     ADL Overall ADL's : Needs assistance/impaired     Grooming: Min guard;Oral care;Wash/dry hands;Wash/dry face;Standing       Lower Body Bathing: Moderate assistance;Sit to/from stand       Lower Body Dressing: Moderate assistance;Sit to/from stand   Toilet Transfer: Min guard;Comfort height toilet;Ambulation;RW   Toileting- Clothing Manipulation and Hygiene: Minimal assistance;Sit to/from stand       Functional mobility during ADLs: Min guard;Rolling walker General ADL Comments:  Instructed in use of AE for LB ADL and toilet aide.  Pt has access to a 3 in 1.  Instructed pt in multiple uses of 3 in1 including as a shower seat.  Educated at length in back precautions related to IADL.        Vision                     Perception     Praxis      Cognition   Behavior During Therapy: WFL for tasks assessed/performed Overall Cognitive Status: Within Functional Limits for tasks assessed                       Extremity/Trunk Assessment               Exercises     Shoulder Instructions       General Comments      Pertinent Vitals/ Pain       5/10, back, RN provided meds  Home Living                                          Prior Functioning/Environment              Frequency Min 3X/week     Progress Toward Goals  OT Goals(current goals  can now be found in the care plan section)  Progress towards OT goals: Progressing toward goals  Acute Rehab OT Goals Patient Stated Goal: home tomorrow  Plan Discharge plan remains appropriate    Co-evaluation                 End of Session Equipment Utilized During Treatment: Gait belt;Rolling walker;Back brace   Activity Tolerance Patient limited by pain   Patient Left in chair;with call bell/phone within reach   Nurse Communication          Time: 8119-1478 OT Time Calculation (min): 35 min  Charges: OT General Charges $OT Visit: 1 Procedure OT Treatments $Self Care/Home Management : 23-37 mins  Evern Bio 03/26/2014, 10:02 AM [phone]

## 2014-03-26 NOTE — Progress Notes (Signed)
Physical Therapy Treatment Patient Details Name: Ricardo KennerMarc Grieger MRN: [id number] DOB: 1965/12/17 Today's Date: 03/26/2014    History of Present Illness Posterior decompression of L2-S1.    PT Comments    Patient able to increase ambulation despite continued pain. Ambulation is slow and labored. Patient unable to attempt steps this session and will need to complete 12 steps in order to DC home safely. 2  Follow Up Recommendations  Home health PT;Supervision/Assistance - 24 hour     Equipment Recommendations  Rolling walker with 5" wheels    Recommendations for Other Services       Precautions / Restrictions Precautions Precautions: Back Precaution Comments: Patient able to recall 1/3 back precautions. Cues for no twisitng and arching Required Braces or Orthoses: Spinal Brace Spinal Brace: Lumbar corset;Applied in sitting position    Mobility  Bed Mobility               General bed mobility comments: Patient sitting up in recliner. Able to verbalize log roll technique  Transfers Overall transfer level: Needs assistance Equipment used: Rolling walker (2 wheeled)   Sit to Stand: Min guard         General transfer comment: Vcs for safe hand placement  Ambulation/Gait Ambulation/Gait assistance: Min guard Ambulation Distance (Feet): 100 Feet Assistive device: Rolling walker (2 wheeled) Gait Pattern/deviations: Step-through pattern;Decreased stride length Gait velocity: decreased   General Gait Details: Patient continues with guarded gait and heavy reliance on RW.    Stairs            Wheelchair Mobility    Modified Rankin (Stroke Patients Only)       Balance                                    Cognition Arousal/Alertness: Awake/alert Behavior During Therapy: WFL for tasks assessed/performed Overall Cognitive Status: Within Functional Limits for tasks assessed                      Exercises      General Comments         Pertinent Vitals/Pain 6/10 back pain patient repositioned for comfort     Home Living                      Prior Function            PT Goals (current goals can now be found in the care plan section) Progress towards PT goals: Progressing toward goals    Frequency  Min 5X/week    PT Plan Current plan remains appropriate    Co-evaluation             End of Session Equipment Utilized During Treatment: Gait belt;Back brace Activity Tolerance: Patient limited by fatigue Patient left: in chair;with call bell/phone within reach     Time: 0830-0847 PT Time Calculation (min): 17 min  Charges:  $Gait Training: 8-22 mins                    G Codes:      Fredrich BirksRobinette, Julia Elizabeth 03/26/2014, 8:49 AM 03/26/2014 Fredrich Birksobinette, Julia Elizabeth PTA [phone] pager [phone] office

## 2014-03-26 NOTE — Care Management Note (Signed)
CARE MANAGEMENT NOTE 03/26/2014  [facility]  [facility] LPA[facility]Yong   Account Number:  [id number]  Date Initiated:  03/26/2014  Documentation initiated by:  Vance PeperBRADY,Enes Wegener  Subjective/Objective Assessment:   48 yr old male s/p L2-S1 posterior decompression.     Action/Plan:   Case manager spoke with patient concerning home health and DMe needs at discharge. Choice offered. [facility]. Patient states he has rolling walker.Family support.   Anticipated DC Date:  03/27/2014   Anticipated DC Plan:  HOME W HOME HEALTH SERVICES      DC Planning Services  CM consult      [facility]   Choice offered to / List presented to:  C-1 Patient   DME arranged  NA        HH arranged  HH-2 PT      HH agency  [facility]   Status of service:  Completed, signed off Medicare Important Message given?   (If response is "NO", the following Medicare IM given date fields will be blank) Date Medicare IM given:   Medicare IM given by:   Date Additional Medicare IM given:   Additional Medicare IM given by:    Discharge Disposition:  HOME W HOME HEALTH SERVICES  Per UR Regulation:  Reviewed for med. necessity/level of care/duration of stay  If discussed at Long Length of Stay Meetings, dates discus

## 2014-03-27 NOTE — Progress Notes (Addendum)
Occupational Therapy Treatment Patient Details Name: Ricardo Flores MRN: [id number] DOB: February 19, 1966 Today's Date: 03/27/2014    History of present illness Posterior decompression of L2-S1.   OT comments  Pt moving well. Education provided to pt and spouse. Feel pt is safe to d/c home, from OT standpoint.  Follow Up Recommendations  No OT follow up    Equipment Recommendations  None recommended by OT    Recommendations for Other Services      Precautions / Restrictions Precautions Precautions: Back Precaution Comments: Pt able to state 3/3 back precautions Required Braces or Orthoses: Spinal Brace Spinal Brace: Lumbar corset;Applied in sitting position Restrictions Weight Bearing Restrictions: No       Mobility Bed Mobility Overal bed mobility: Needs Assistance Bed Mobility: Rolling;Sidelying to Sit;Sit to Sidelying Rolling: Supervision Sidelying to sit: Supervision     Sit to sidelying: Min assist General bed mobility comments: Assistance with LE's when going to sidelying position.  Transfers Overall transfer level: Needs assistance Equipment used: Rolling walker (2 wheeled) Transfers: Sit to/from Stand Sit to Stand: Supervision         General transfer comment: cues for hand placement.    Balance                                   ADL Overall ADL's : Needs assistance/impaired                 Upper Body Dressing : Supervision/safety;Sitting (back brace)   Lower Body Dressing: Set up;Supervision/safety;Sit to/from stand;With adaptive equipment   Toilet Transfer: Supervision/safety;Ambulation;RW (chair)       Tub/ Shower Transfer: Min guard;Ambulation;Shower seat;Rolling walker   Functional mobility during ADLs: Supervision/safety;Min guard;Rolling walker (Min guard for shower transfer) General ADL Comments: Educated on shower transfer techniques as well as tub/shower technique. Practiced shower transfer. Educated on AE for LB ADLs and  pt practiced. Educated on safety tips for home (rugs, safe shoewear, use of bag on walker). Educated on toilet aid for hygiene. Educated on back brace.  Recommended wife be with him for shower transfer. Educated on placement of grooming items to avoid breaking precautions.       Vision                     Perception     Praxis      Cognition   Behavior During Therapy: [facility] for tasks assessed/performed Overall Cognitive Status: Within Functional Limits for tasks assessed                       Extremity/Trunk Assessment               Exercises     Shoulder Instructions       General Comments      Pertinent Vitals/ Pain      Pain 7/10. Nurse notified.  Home Living                                          Prior Functioning/Environment              Frequency Min 3X/week     Progress Toward Goals  OT Goals(current goals can now be found in the care plan section)  Progress towards OT goals: Progressing toward goals  Acute Rehab OT Goals  Patient Stated Goal: not stated OT Goal Formulation: With patient Time For Goal Achievement: 04/01/14 Potential to Achieve Goals: Good  Plan Discharge plan remains appropriate    Co-evaluation                 End of Session Equipment Utilized During Treatment: Gait belt;Rolling walker;Back brace   Activity Tolerance Patient tolerated treatment well   Patient Left Other (comment) (with wife in bathroom-nurse notified)   Nurse Communication Mobility status;Other (comment) (with wife in bathroom ); pain level        Time: 1130-1159 OT Time Calculation (min): 29 min  Charges: OT General Charges $OT Visit: 1 Procedure OT Treatments $Self Care/Home Management : 23-37 mins  Earlie RavelingStraub, Orla Estrin L OTR/L [phone] 03/27/2014, 2:22 PM

## 2014-03-27 NOTE — Progress Notes (Signed)
Subjective: 3 Days Post-Op Procedure(s) (LRB): LUMBAR DECOMPRESSION L2-S1 (N/A) Patient reports pain as moderate.  Controlled withoral pain meds.  + BM last night.  Denies numbness, tingling or weakness in LEs.  Doing well with PT.  Wants to go home.  Objective: Vital signs in last 24 hours: Temp:  [97.5 F (36.4 C)-98.2 F (36.8 C)] 97.5 F (36.4 C) (07/26 0533) Pulse Rate:  [68-79] 68 (07/26 0533) Resp:  [18-20] 20 (07/26 0533) BP: (109-117)/(58-64) 109/59 mmHg (07/26 0533) SpO2:  [96 %-98 %] 97 % (07/26 0533)  Intake/Output from previous day: 07/25 0701 - 07/26 0700 In: 243 [P.O.:240; I.V.:3] Out: -  Intake/Output this shift:    No results found for this basename: HGB,  in the last 72 hours No results found for this basename: WBC, RBC, HCT, PLT,  in the last 72 hours No results found for this basename: NA, K, CL, CO2, BUN, CREATININE, GLUCOSE, CALCIUM,  in the last 72 hours No results found for this basename: LABPT, INR,  in the last 72 hours  PE:  wn wd male in nad.  Wound dressed and dry.  B LEs with stable NV exam.  Assessment/Plan: 3 Days Post-Op Procedure(s) (LRB): LUMBAR DECOMPRESSION L2-S1 (N/A) Up with therapy  Hom e after PT.  Toni ArthursHEWITT, Nikeria Kalman 03/27/2014, 7:58 AM

## 2014-03-27 NOTE — Progress Notes (Signed)
Physical Therapy Treatment Patient Details Name: Ricardo Flores MRN: [id number] DOB: Jul 01, 1966 Today'Flores Date: 03/27/2014    History of Present Illness Posterior decompression of L2-S1.    PT Comments    Patient is progressing well towards physical therapy goals, ambulating up to 225 feet with supervision while using a rolling walker. Safely completed stair training with no further questions regarding this task. All education has been reviewed and feel patient is adequate for d/c from a mobility standpoint.  Follow Up Recommendations  Home health PT;Supervision/Assistance - 24 hour     Equipment Recommendations  Rolling walker with 5" wheels    Recommendations for Other Services       Precautions / Restrictions Precautions Precautions: Back Precaution Comments: Reviewed back precautions. Correctly verbalizes 3/3.   Required Braces or Orthoses: Spinal Brace Spinal Brace: Lumbar corset;Applied in sitting position Restrictions Weight Bearing Restrictions: No    Mobility  Bed Mobility                  Transfers Overall transfer level: Needs assistance Equipment used: Rolling walker (2 wheeled) Transfers: Sit to/from Stand Sit to Stand: Supervision         General transfer comment: Supervision for safety. Correctly uses hands on stable surface to stand. maintains back precautions.  Ambulation/Gait Ambulation/Gait assistance: Supervision Ambulation Distance (Feet): 225 Feet Assistive device: Rolling walker (2 wheeled) Gait Pattern/deviations: Step-through pattern;Decreased stride length   Gait velocity interpretation: Below normal speed for age/gender General Gait Details: VC for walker positioning with base of support. Maintains back precautions. Slow and guarded gait but no instances of loss of balance or buckling noted.   Stairs Stairs: Yes Stairs assistance: Min guard Stair Management: One rail Right;Step to pattern;Forwards;Sideways;No rails;Backwards;With  walker Number of Stairs: 13 (sideways 2 steps for trial) General stair comments: Educated on safe stair navigation using rail on Rt similar to home environment. Pt completed this task safely and has no further questions regarding this task. training for single step backwards with walker onto step stool to enter high bed. Performed safely while maintaining back precautions.  Wheelchair Mobility    Modified Rankin (Stroke Patients Only)       Balance                                    Cognition Arousal/Alertness: Awake/alert Behavior During Therapy: WFL for tasks assessed/performed Overall Cognitive Status: Within Functional Limits for tasks assessed                      Exercises      General Comments        Pertinent Vitals/Pain Pt reports pain as "better than yesterday" No numerical value given Patient repositioned in chair for comfort.     Home Living                      Prior Function            PT Goals (current goals can now be found in the care plan section) Acute Rehab PT Goals PT Goal Formulation: With patient Time For Goal Achievement: 04/08/14 Potential to Achieve Goals: Good Progress towards PT goals: Progressing toward goals    Frequency  Min 5X/week    PT Plan Current plan remains appropriate    Co-evaluation             End of Session Equipment Utilized  During Treatment: Back brace Activity Tolerance: Patient tolerated treatment well Patient left: with call bell/phone within reach;in chair     Time: [id number] PT Time Calculation (min): 26 min  Charges:  $Gait Training: 8-22 mins $Self Care/Home Management: 8-22                    G Codes:      Ricardo Flores, [place]PT [phone]  Ricardo MountBarbour, Ricardo Flores 03/27/2014, 11:12 AM

## 2014-04-20 NOTE — Discharge Summary (Signed)
Patient ID: Ricardo Flores MRN: [id number] DOB/AGE: 50-18-1967 50 y.o.  Admit date: 03/24/2014 Discharge date: 04/20/2014  Admission Diagnoses:  Active Problems:   Spinal stenosis   Discharge Diagnoses:  Active Problems:   Spinal stenosis  status post Procedure(s): LUMBAR DECOMPRESSION L2-S1  Past Medical History  Diagnosis Date  . Pneumonia 2006  . Headache(784.0)   . Lumbar stenosis 2015  . Cricopharyngeal dysphagia     since ACDF  . GERD (gastroesophageal reflux disease)     Surgeries: Procedure(s): LUMBAR DECOMPRESSION L2-S1 on 03/24/2014   Consultants:    Discharged Condition: Improved  Hospital Course: Ricardo Flores is an 48 y.o. male who was admitted 03/24/2014 for operative treatment of lumbar stenosis. . Patient failed conservative treatments (please see the history and physical for the specifics) and had severe unremitting pain that affects sleep, daily activities and work/hobbies. After pre-op clearance, the patient was taken to the operating room on 03/24/2014 and underwent  Procedure(s): LUMBAR DECOMPRESSION L2-S1.    Patient was given perioperative antibiotics:  Anti-infectives   Start     Dose/Rate Route Frequency Ordered Stop   03/24/14 1830  ceFAZolin (ANCEF) IVPB 1 g/50 mL premix     1 g 100 mL/hr over 30 Minutes Intravenous Every 8 hours 03/24/14 1823 03/25/14 [id number]       Patient was given sequential compression devices and early ambulation to prevent DVT.   Patient benefited maximally from hospital stay and there were no complications. At the time of discharge, the patient was urinating/moving their bowels without difficulty, tolerating a regular diet, pain is controlled with oral pain medications and they have been cleared by PT/OT.   Recent vital signs: No data found.    Recent laboratory studies: No results found for this basename: WBC, HGB, HCT, PLT, NA, K, CL, CO2, BUN, CREATININE, GLUCOSE, PT, INR, CALCIUM, 2,  in the last 72  hours   Discharge Medications:     Medication List    STOP taking these medications       meloxicam 15 MG tablet  Commonly known as:  MOBIC      TAKE these medications       aspirin-acetaminophen-caffeine 250-250-65 MG per tablet  Commonly known as:  EXCEDRIN MIGRAINE  Take 1 tablet by mouth every 6 (six) hours as needed for headache.     docusate sodium 100 MG capsule  Commonly known as:  COLACE  Take 1 capsule (100 mg total) by mouth 2 (two) times daily.     methocarbamol 500 MG tablet  Commonly known as:  ROBAXIN  Take 1 tablet (500 mg total) by mouth every 6 (six) hours as needed for muscle spasms.     ondansetron 4 MG disintegrating tablet  Commonly known as:  ZOFRAN ODT  Take 1 tablet (4 mg total) by mouth every 8 (eight) hours as needed.     oxyCODONE-acetaminophen 10-325 MG per tablet  Commonly known as:  PERCOCET  Take 1 tablet by mouth every 6 (six) hours as needed.     polyethylene glycol packet  Commonly known as:  MIRALAX / GLYCOLAX  Take 17 g by mouth daily.        Diagnostic Studies: Dg Lumbar Spine 2-3 Views  03/24/2014   CLINICAL DATA:  L2-5 microdiscectomy  EXAM: LUMBAR SPINE - 2-3 VIEW  COMPARISON:  None.  FINDINGS: Single lateral portable radiograph of the lumbar spine. Posterior metallic needle with the tip projecting just posterior to the L2-3 neural foramen. There is  a second metallic needle which is located posterior to the S2 level.  There is degenerative disc disease most significant at L4-5 and L5-S1.  IMPRESSION: Intraoperative localization radiograph as described above.   Electronically Signed   By: Elige Ko   On: 03/24/2014 15:55   Dg Lumbar Spine 1 View  03/24/2014   CLINICAL DATA:  Spinal stenosis.  EXAM: LUMBAR SPINE - 1 VIEW  COMPARISON:  No priors.  FINDINGS: A single cross-table lateral view of the lumbar spine is submitted for evaluation. This demonstrates soft tissue retractors in place posterior to L3. Surgical probes are noted,  with 1 probe directed cephalad in place with tip posterior to the inferior aspect of the L2 vertebral body, and an additional probe in place directed caudally with tip posterior to the S1 vertebra.  IMPRESSION: 1. Intraoperative localization, as above.   Electronically Signed   By: Trudie Reed M.D.   On: 03/24/2014 14:31        Discharge Instructions   Call MD / Call 911    Complete by:  As directed   If you experience chest pain or shortness of breath, CALL 911 and be transported to the hospital emergency room.  If you develope a fever above 101 F, pus (white drainage) or increased drainage or redness at the wound, or calf pain, call your surgeon's office.     Constipation Prevention    Complete by:  As directed   Drink plenty of fluids.  Prune juice may be helpful.  You may use a stool softener, such as Colace (over the counter) 100 mg twice a day.  Use MiraLax (over the counter) for constipation as needed.     Diet - low sodium heart healthy    Complete by:  As directed      Increase activity slowly as tolerated    Complete by:  As directed            Follow-up Information   Follow up with [facility]. (Someone from [facility] will contact you concerning start date and time for physical therapy.)    Contact information:   [address] [place] [address] [phone]       Discharge Plan:  discharge to home  Disposition:     Signed: Naida Sleight for Dr[facility] Venita [facility] [phone] 04/20/2014, 8:33 AM

## 2014-04-20 NOTE — Discharge Summary (Signed)
Agree with above F/U as needed 

## 2015-08-08 IMAGING — CR DG LUMBAR SPINE 2-3V
2 series · 2 of 2 positions shown · non-contrast
Comparison: None.

CLINICAL DATA: L2-5 microdiscectomy

EXAM:
LUMBAR SPINE - 2-3 VIEW

[lateral (1 of 2)]
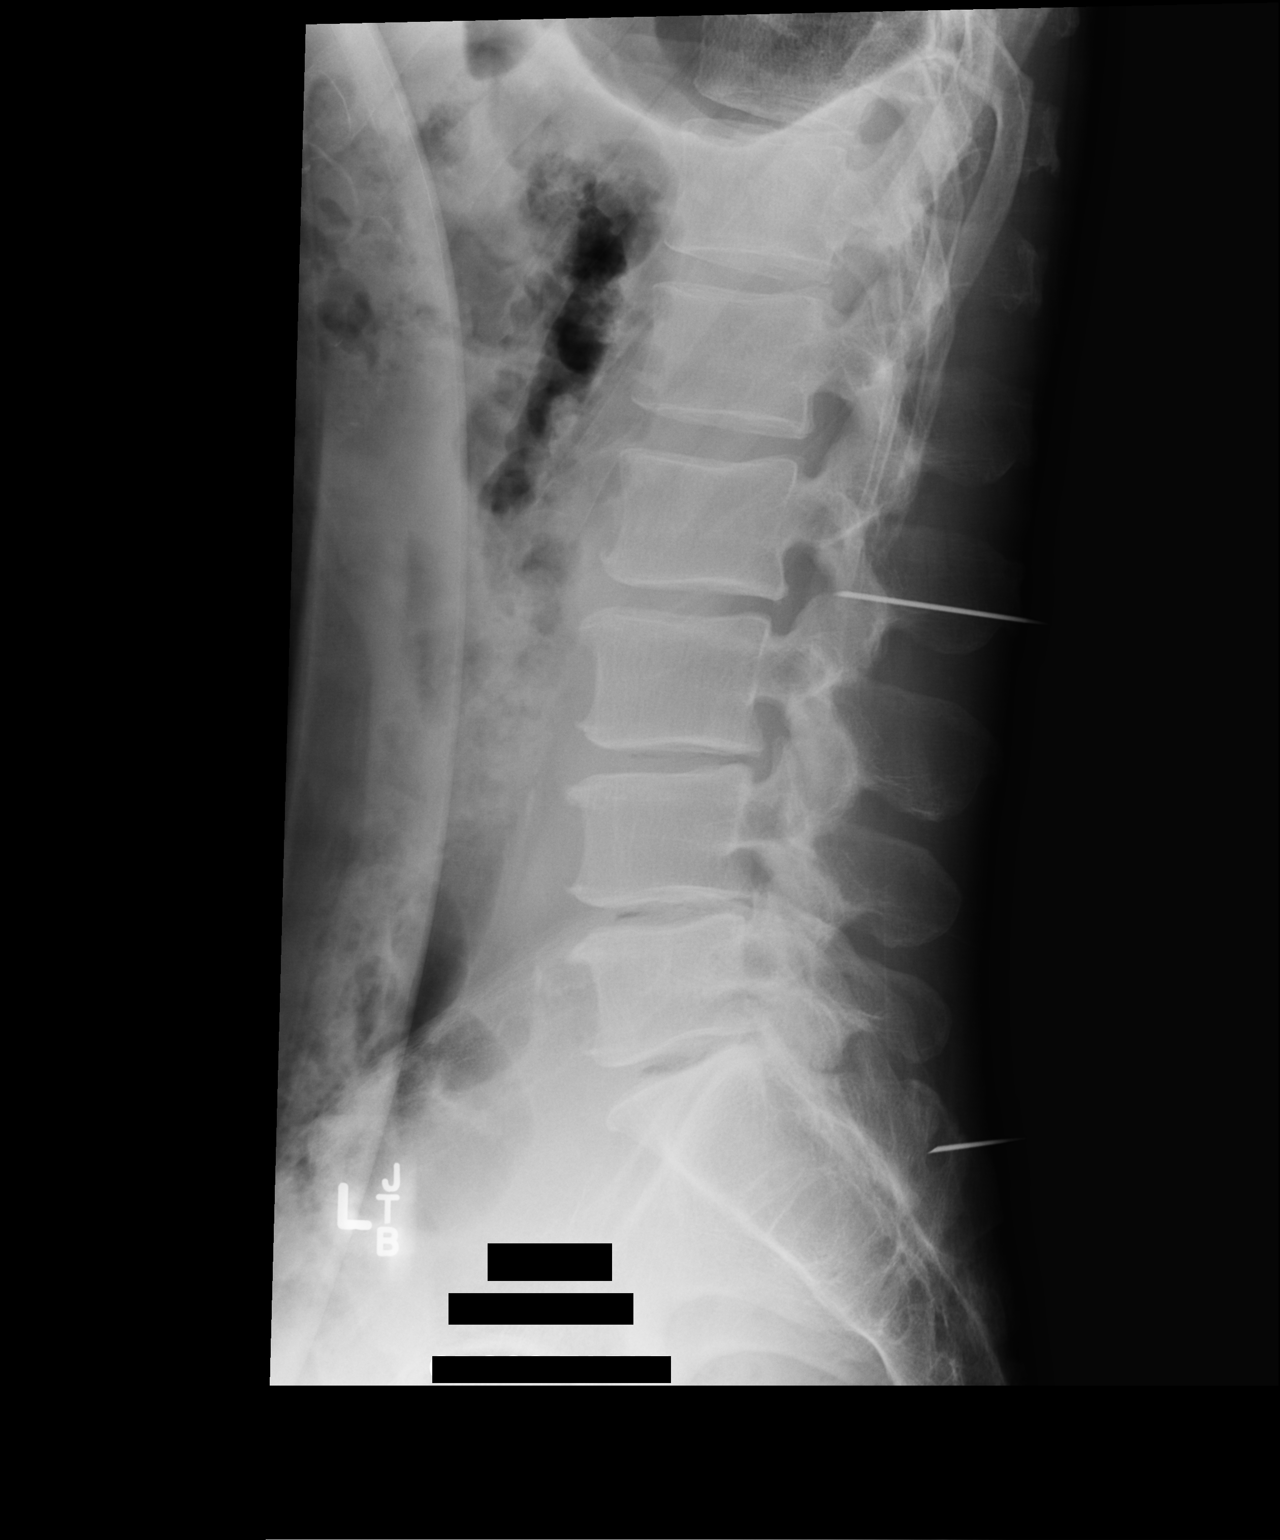

[lateral (2 of 2)]
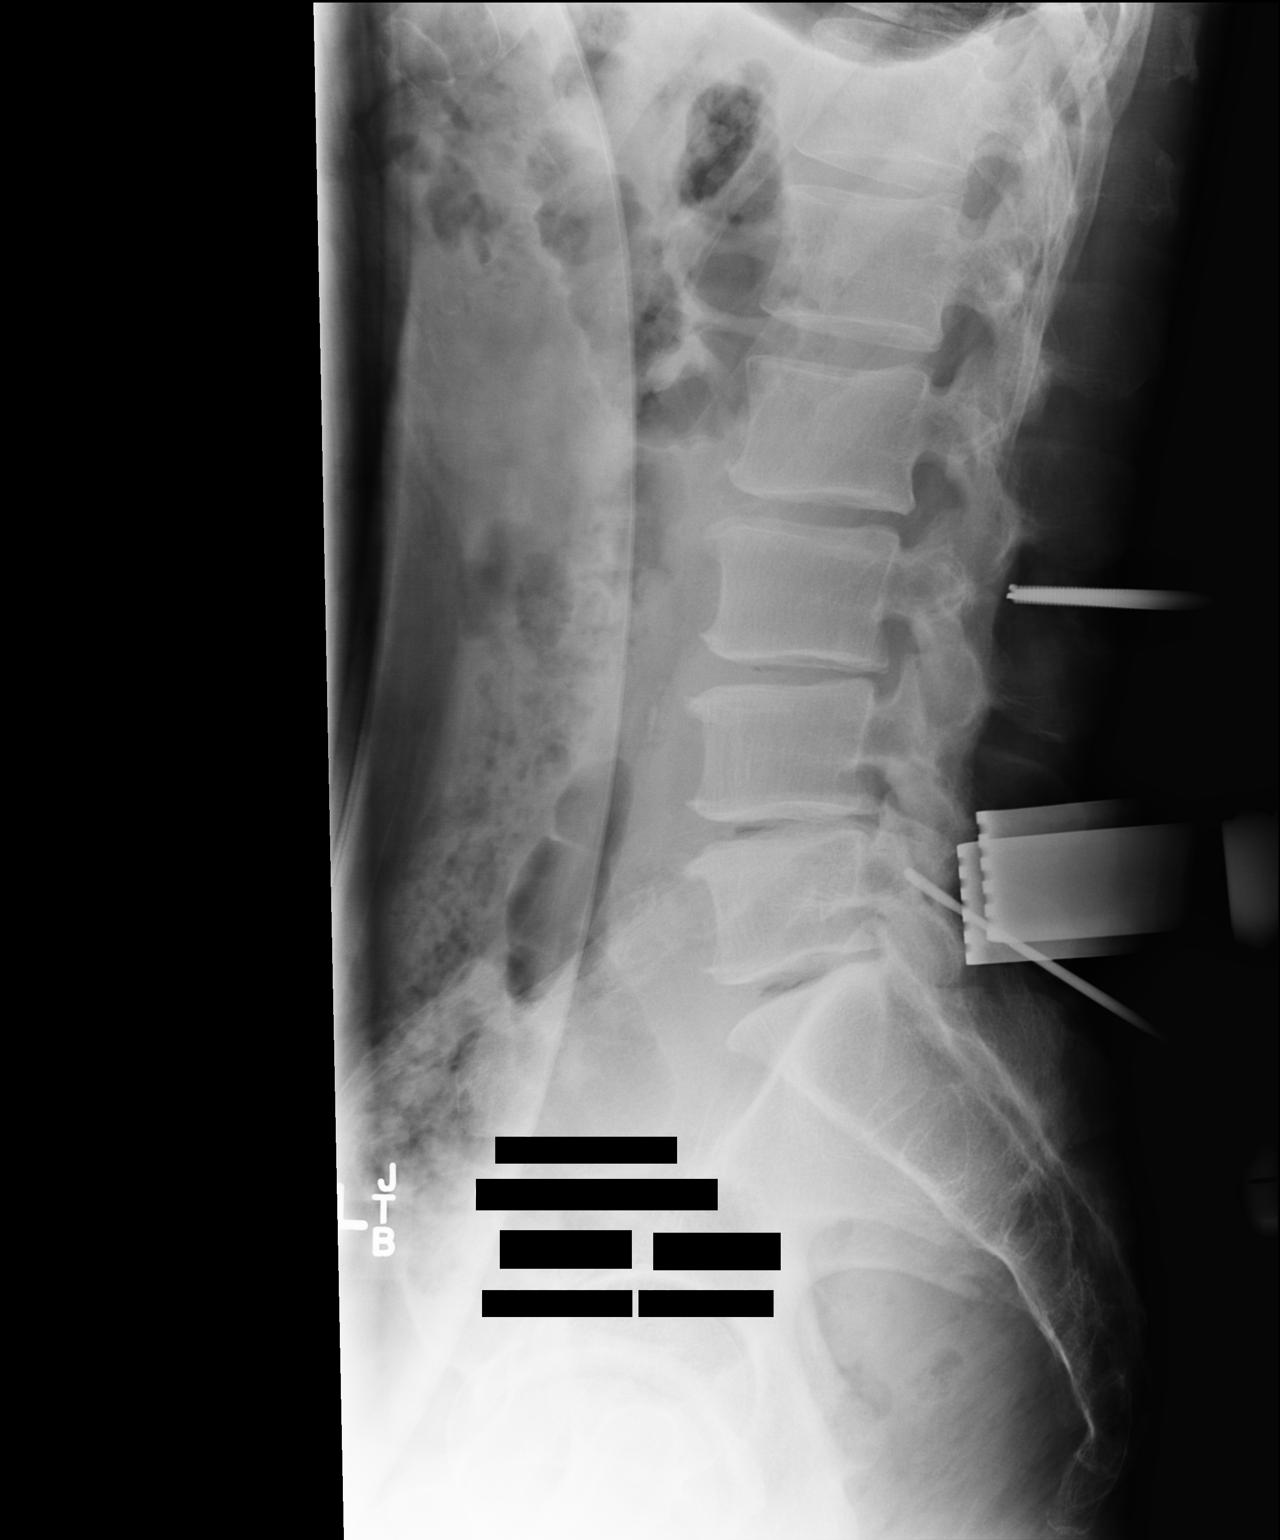

[2 of 2 positions shown; findings below may reference images not displayed]

FINDINGS: Single lateral portable radiograph of the lumbar spine. Posterior
metallic needle with the tip projecting just posterior to the L2-3
neural foramen. There is a second metallic needle which is located
posterior to the S2 level.

There is degenerative disc disease most significant at L4-5 and
L5-S1.
IMPRESSION: Intraoperative localization radiograph as described above.
